# Patient Record
Sex: Male | Born: 1960 | Race: White | Hispanic: No | Marital: Married | Smoking: Never smoker
Health system: Southern US, Community
[De-identification: ages and names within clinical notes are randomized; demographics above are authoritative.]

---

## 2016-10-07 ENCOUNTER — Encounter (INDEPENDENT_AMBULATORY_CARE_PROVIDER_SITE_OTHER): Payer: Self-pay

## 2016-10-07 ENCOUNTER — Ambulatory Visit (HOSPITAL_BASED_OUTPATIENT_CLINIC_OR_DEPARTMENT_OTHER)
Admission: RE | Admit: 2016-10-07 | Discharge: 2016-10-07 | Disposition: A | Discharge status: Home / Self Care | Payer: Managed Care, Other (non HMO) | Source: Ambulatory Visit | Attending: Podiatry | Admitting: Podiatry

## 2016-10-07 ENCOUNTER — Other Ambulatory Visit: Payer: Self-pay | Admitting: Podiatry

## 2016-10-07 ENCOUNTER — Encounter: Payer: Self-pay | Admitting: Podiatry

## 2016-10-07 ENCOUNTER — Ambulatory Visit (INDEPENDENT_AMBULATORY_CARE_PROVIDER_SITE_OTHER): Payer: Managed Care, Other (non HMO) | Admitting: Podiatry

## 2016-10-07 VITALS — BP 128/87 | HR 89 | Resp 18

## 2016-10-07 DIAGNOSIS — M25572 Pain in left ankle and joints of left foot: Secondary | ICD-10-CM | POA: Diagnosis not present

## 2016-10-07 DIAGNOSIS — S93401A Sprain of unspecified ligament of right ankle, initial encounter: Secondary | ICD-10-CM

## 2016-10-07 DIAGNOSIS — M898X7 Other specified disorders of bone, ankle and foot: Secondary | ICD-10-CM | POA: Diagnosis present

## 2016-10-07 DIAGNOSIS — M779 Enthesopathy, unspecified: Secondary | ICD-10-CM | POA: Diagnosis not present

## 2016-10-07 DIAGNOSIS — M7732 Calcaneal spur, left foot: Secondary | ICD-10-CM | POA: Insufficient documentation

## 2016-10-07 DIAGNOSIS — G8929 Other chronic pain: Secondary | ICD-10-CM | POA: Diagnosis not present

## 2016-10-07 NOTE — Patient Instructions (Signed)
Ankle Sprain, Phase I Rehab  Ask your health care provider which exercises are safe for you. Do exercises exactly as told by your health care provider and adjust them as directed. It is normal to feel mild stretching, pulling, tightness, or discomfort as you do these exercises, but you should stop right away if you feel sudden pain or your pain gets worse. Do not begin these exercises until told by your health care provider.  Stretching and range of motion exercises  These exercises warm up your muscles and joints and improve the movement and flexibility of your lower leg and ankle. These exercises also help to relieve pain and stiffness.  Exercise A: Gastroc and soleus stretch     1. Sit on the floor with your left / right leg extended.  2. Loop a belt or towel around the ball of your left / right foot. The ball of your foot is on the walking surface, right under your toes.  3. Keep your left / right ankle and foot relaxed and keep your knee straight while you use the belt or towel to pull your foot toward you. You should feel a gentle stretch behind your calf or knee.  4. Hold this position for __________ seconds, then release to the starting position.  Repeat the exercise with your knee bent. You can put a pillow or a rolled bath towel under your knee to support it. You should feel a stretch deep in your calf or at your Achilles tendon.  Repeat each stretch __________ times. Complete these stretches __________ times a day.  Exercise B: Ankle alphabet     1. Sit with your left / right leg supported at the lower leg.  ? Do not rest your foot on anything.  ? Make sure your foot has room to move freely.  2. Think of your left / right foot as a paintbrush, and move your foot to trace each letter of the alphabet in the air. Keep your hip and knee still while you trace. Make the letters as large as you can without feeling discomfort.  3. Trace every letter from A to Z.  Repeat __________ times. Complete this exercise  __________ times a day.  Strengthening exercises  These exercises build strength and endurance in your ankle and lower leg. Endurance is the ability to use your muscles for a long time, even after they get tired.  Exercise C: Dorsiflexors     1. Secure a rubber exercise band or tube to an object, such as a table leg, that will stay still when the band is pulled. Secure the other end around your left / right foot.  2. Sit on the floor facing the object, with your left / right leg extended. The band or tube should be slightly tense when your foot is relaxed.  3. Slowly bring your foot toward you, pulling the band tighter.  4. Hold this position for __________ seconds.  5. Slowly return your foot to the starting position.  Repeat __________ times. Complete this exercise __________ times a day.  Exercise D: Plantar flexors     1. Sit on the floor with your left / right leg extended.  2. Loop a rubber exercise tube or band around the ball of your left / right foot. The ball of your foot is on the walking surface, right under your toes.  ? Hold the ends of the band or tube in your hands.  ? The band or tube should be slightly   tense when your foot is relaxed.  3. Slowly point your foot and toes downward, pushing them away from you.  4. Hold this position for __________ seconds.  5. Slowly return your foot to the starting position.  Repeat __________ times. Complete this exercise __________ times a day.  Exercise E: Evertors   1. Sit on the floor with your legs straight out in front of you.  2. Loop a rubber exercise band or tube around the ball of your left / right foot. The ball of your foot is on the walking surface, right under your toes.  ? Hold the ends of the band in your hands, or secure the band to a stable object.  ? The band or tube should be slightly tense when your foot is relaxed.  3. Slowly push your foot outward, away from your other leg.  4. Hold this position for __________ seconds.  5. Slowly return your  foot to the starting position.  Repeat __________ times. Complete this exercise __________ times a day.  This information is not intended to replace advice given to you by your health care provider. Make sure you discuss any questions you have with your health care provider.  Document Released: 01/29/2005 Document Revised: 03/06/2016 Document Reviewed: 05/14/2015  [employer] Interactive Patient Education © 2017 [employer]

## 2016-10-08 NOTE — Progress Notes (Signed)
Subjective:     Patient ID: Kevin Weber, Kevin Weber   DOB: 11/09/1960, 56 y.o.   MRN: [id number]  HPI 56 year old Kevin Weber presents the office of consent the left ankle pain which has been ongoing for about 1 year. He states that he is intermittent in nature. He states that if he plays basketball or after being active his ankle be sore afterwards. Denies any numbness or tingling. No recent injury or trauma. He had no treatment for this. Denies any swelling. He has no other complaints.  Review of Systems  All other systems reviewed and are negative.      Objective:   Physical Exam General: AAO x3, NAD  Dermatological: Nails hypertrophic, dystrophic, discolored with yellow discoloration. There is no edema, erythema, drainage or pus. There is no clinical signs of infection. No open lesions are identified.  Vascular: Dorsalis Pedis artery and Posterior Tibial artery pedal pulses are 2/4 bilateral with immedate capillary fill time. There is no pain with calf compression, swelling, warmth, erythema.   Neruologic: Grossly intact via light touch bilateral. Vibratory intact via tuning fork bilateral. Protective threshold with Semmes Wienstein monofilament intact to all pedal sites bilateral.   Musculoskeletal:  At this time there is no pain to the lateral or medial ankle ligament of the syndesmosis the left ankle. Anterior drawer and talar tilt test is negative. There is no pain with ankle joint and subtalar joint. There is no pain of the foot. There is no amount edema, erythema, increase in warmth. He is a rectus foot type although he does flatten out mildly more during gait. MMT 5/5. Range of motion intact.  Gait: Unassisted, Nonantalgic.      Assessment:      56 year old Kevin Weber with Intermittent left ankle pain currently without symptoms  Plan:     -Treatment options discussed including all alternatives, risks, and complications -Etiology of symptoms were discussed -X-rays were obtained and reviewed  with the patient.  -I discussed and she can I discussed with him over-the-counter insert that he can wear inside of his shoes. Discussed with ankle brace as needed symptoms continue or bleeding MRI however he is currently not expect any symptoms will continue to monitor closely.  -Follow-up with symptoms not resolve the next 1-2 months or sooner if needed. Call any questions or concerns in the meantime.   Ovid CurdMatthew Maleyah Evans, DPM

## 2018-04-23 IMAGING — DX DG ANKLE COMPLETE 3+V*L*
3 series · 3 of 3 positions shown · non-contrast
Comparison: None.

CLINICAL DATA: Ankle pain times several months without known injury

EXAM:
LEFT ANKLE COMPLETE - 3+ VIEW

[ankle ap]
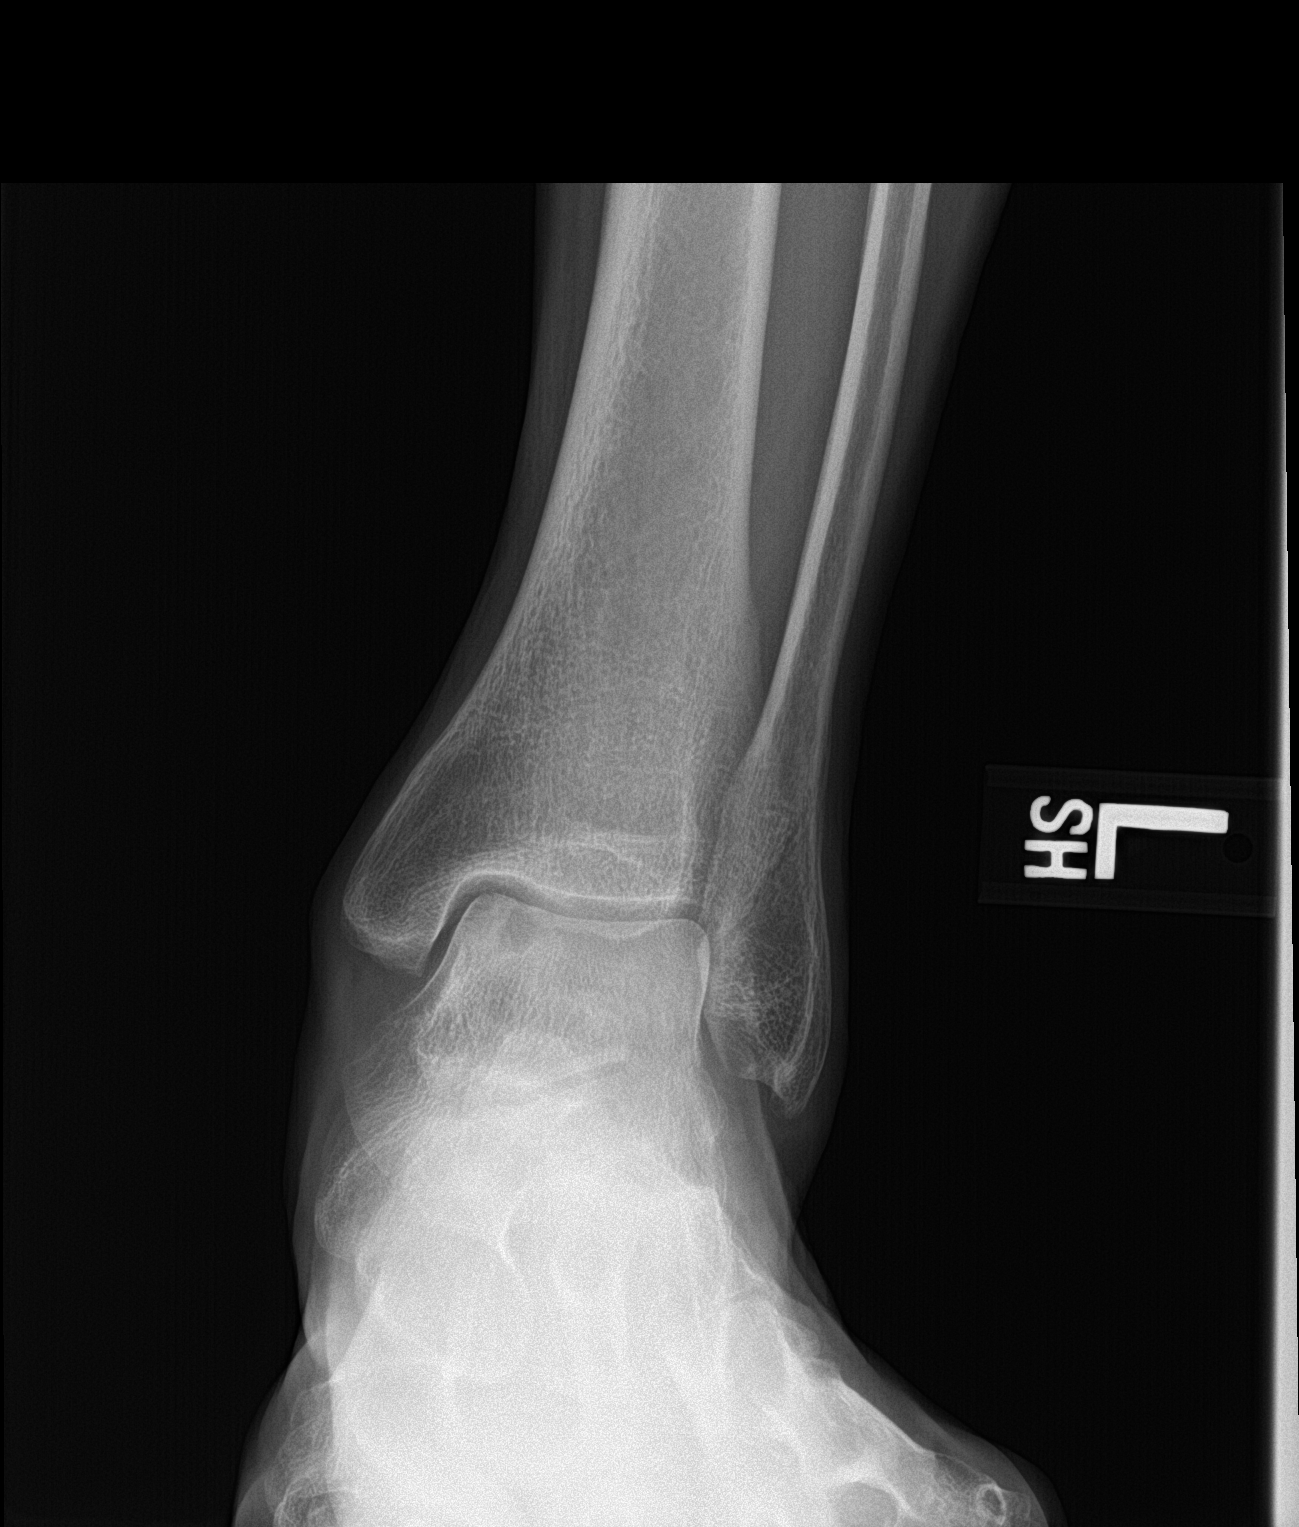

[ankle obl]
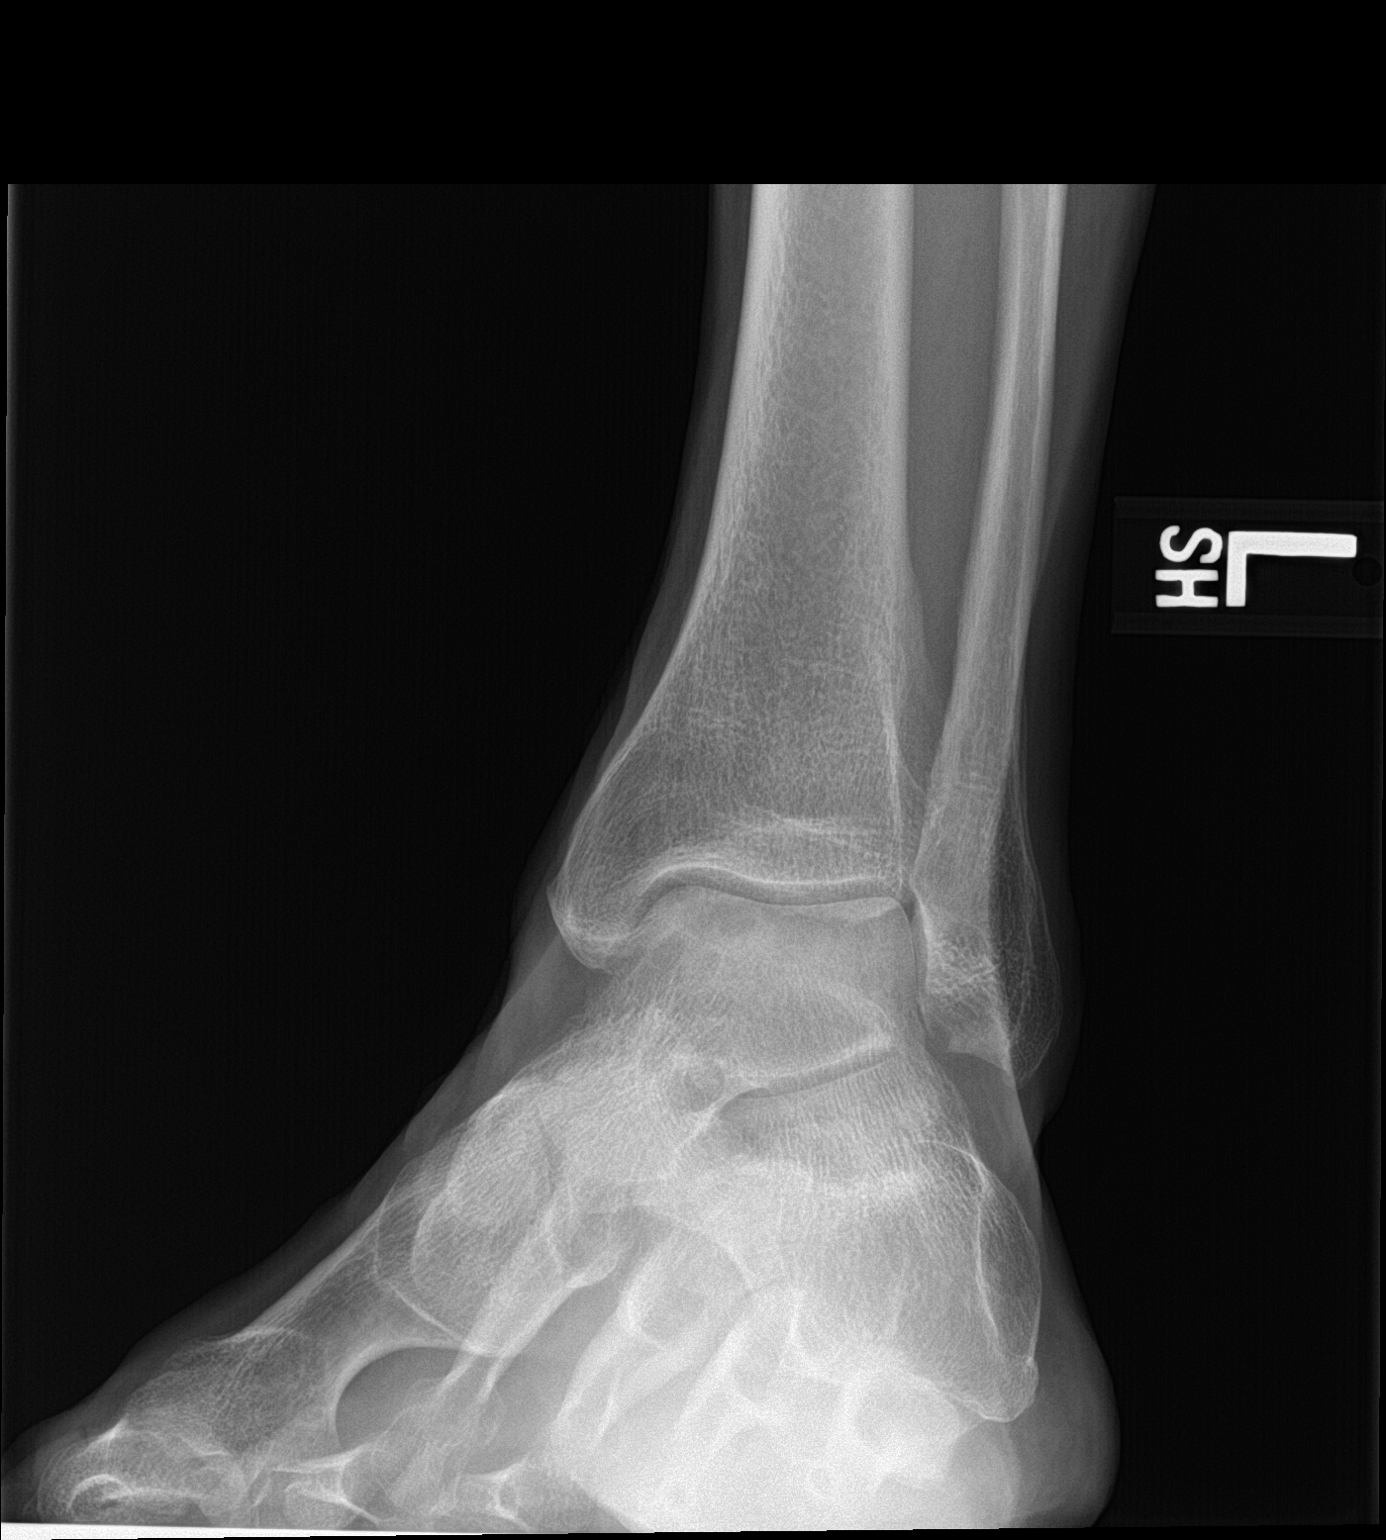

[ankle lat]
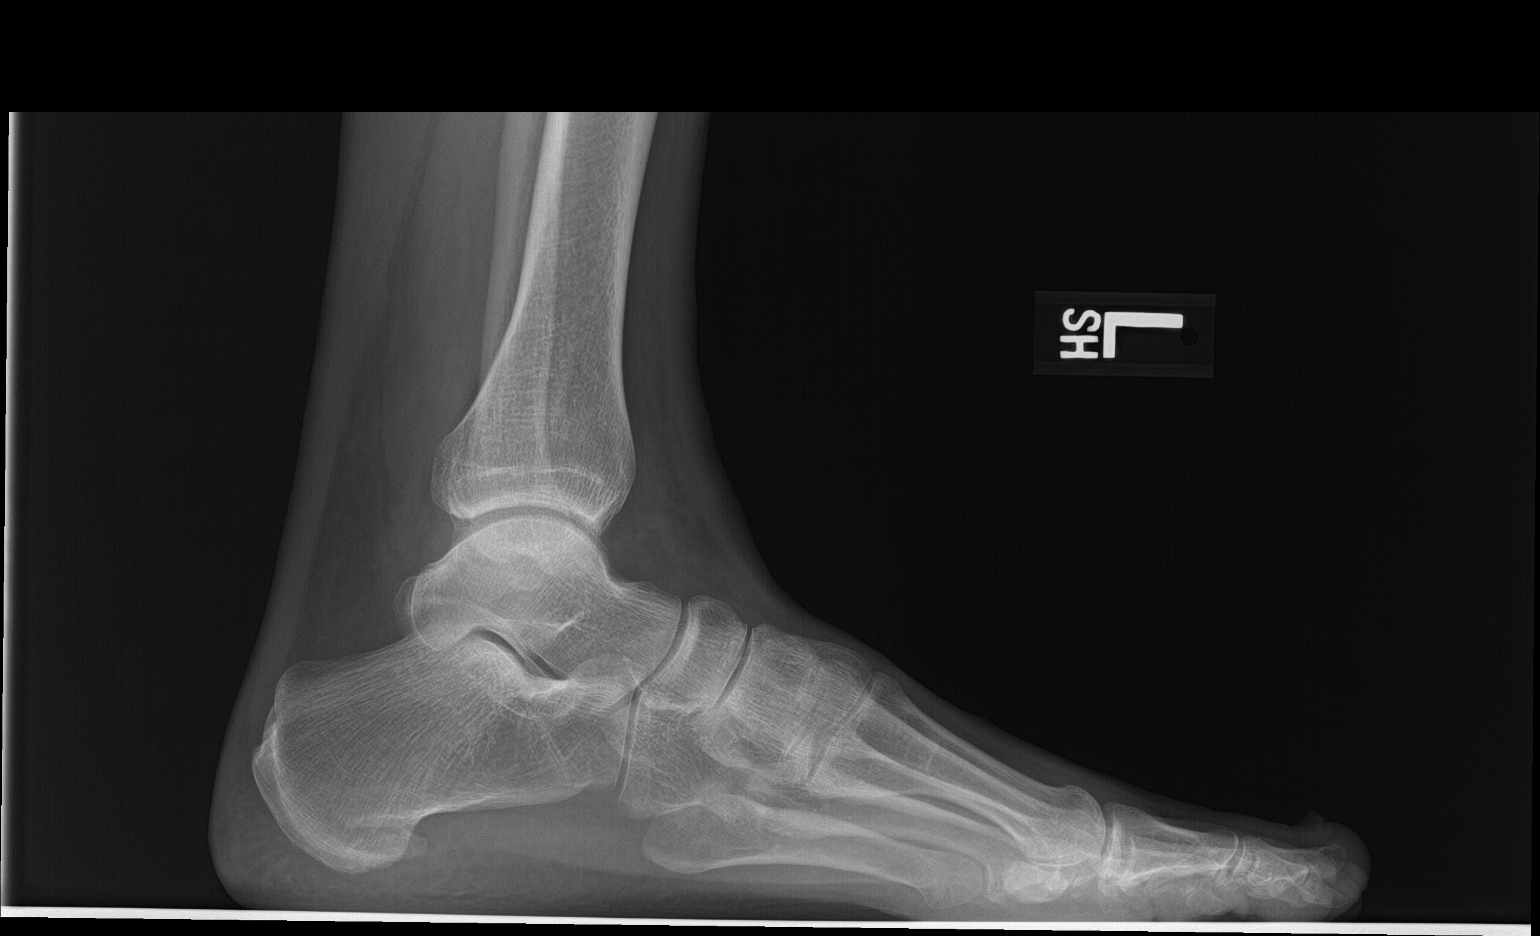

[3 of 3 positions shown; findings below may reference images not displayed]

FINDINGS: Subchondral lucencies of the medial talar dome may represent changes
of osteochondritis dissecans. No intra-articular loose bodies are
apparent. Ankle mortise is maintained. No significant soft tissue
swelling. Base of fifth metatarsal appears intact. Prominent plantar
and tiny dorsal calcaneal enthesophytes. Subtalar and midfoot
articulations are maintained.
IMPRESSION: Subchondral lucencies involving the medial talar dome which may
represent changes of an osteochondral injury/ osteochondritis
dissecans.

Prominent plantar and tiny dorsal calcaneal enthesophytes.
